# Patient Record
Sex: Male | Born: 1960 | Race: White | Hispanic: No | Marital: Single | Smoking: Never smoker
Health system: Southern US, Community
[De-identification: ages and names within clinical notes are randomized; demographics above are authoritative.]

## PROBLEM LIST (undated history)

## (undated) DIAGNOSIS — E785 Hyperlipidemia, unspecified: Secondary | ICD-10-CM

## (undated) DIAGNOSIS — F79 Unspecified intellectual disabilities: Secondary | ICD-10-CM

## (undated) HISTORY — DX: Hyperlipidemia, unspecified: E78.5

## (undated) HISTORY — DX: Unspecified intellectual disabilities: F79

## (undated) HISTORY — PX: OTHER SURGICAL HISTORY: SHX169

---

## 1997-10-31 ENCOUNTER — Other Ambulatory Visit: Admission: RE | Admit: 1997-10-31 | Discharge: 1997-10-31 | Payer: Self-pay | Admitting: Family Medicine

## 2001-05-16 ENCOUNTER — Emergency Department (HOSPITAL_COMMUNITY): Admission: EM | Admit: 2001-05-16 | Discharge: 2001-05-16 | Payer: Self-pay | Admitting: Emergency Medicine

## 2001-05-22 ENCOUNTER — Emergency Department (HOSPITAL_COMMUNITY): Admission: EM | Admit: 2001-05-22 | Discharge: 2001-05-22 | Payer: Self-pay

## 2011-12-07 ENCOUNTER — Encounter: Payer: Self-pay | Admitting: *Deleted

## 2021-12-10 ENCOUNTER — Telehealth: Payer: Self-pay | Admitting: Gastroenterology

## 2021-12-10 NOTE — Telephone Encounter (Signed)
Received referral to schedule colonoscopy. Patients insurance is not in network with [facility].  Dr. Silverio Decamp is Doc of Day for 12/10/21 AM. Records placed on desk for review.

## 2021-12-14 NOTE — Telephone Encounter (Addendum)
Dr. Lavon Paganini did not approve this request. Transfer request sent to be scanned.

## 2021-12-30 ENCOUNTER — Ambulatory Visit (INDEPENDENT_AMBULATORY_CARE_PROVIDER_SITE_OTHER): Payer: Medicare (Managed Care)

## 2021-12-30 ENCOUNTER — Ambulatory Visit (HOSPITAL_COMMUNITY)
Admission: EM | Admit: 2021-12-30 | Discharge: 2021-12-30 | Disposition: A | Discharge status: Home / Self Care | Payer: Medicare (Managed Care) | Attending: Physician Assistant | Admitting: Physician Assistant

## 2021-12-30 ENCOUNTER — Encounter (HOSPITAL_COMMUNITY): Payer: Self-pay | Admitting: Emergency Medicine

## 2021-12-30 DIAGNOSIS — R109 Unspecified abdominal pain: Secondary | ICD-10-CM | POA: Diagnosis not present

## 2021-12-30 DIAGNOSIS — R4689 Other symptoms and signs involving appearance and behavior: Secondary | ICD-10-CM | POA: Insufficient documentation

## 2021-12-30 DIAGNOSIS — R1030 Lower abdominal pain, unspecified: Secondary | ICD-10-CM | POA: Diagnosis present

## 2021-12-30 DIAGNOSIS — N5089 Other specified disorders of the male genital organs: Secondary | ICD-10-CM | POA: Insufficient documentation

## 2021-12-30 DIAGNOSIS — K429 Umbilical hernia without obstruction or gangrene: Secondary | ICD-10-CM | POA: Insufficient documentation

## 2021-12-30 LAB — POCT URINALYSIS DIPSTICK, ED / UC
Bilirubin Urine: NEGATIVE
Glucose, UA: NEGATIVE mg/dL
Ketones, ur: 15 mg/dL — AB
Leukocytes,Ua: NEGATIVE
Nitrite: NEGATIVE
Protein, ur: NEGATIVE mg/dL
Specific Gravity, Urine: 1.025 (ref 1.005–1.030)
Urobilinogen, UA: 2 mg/dL — ABNORMAL HIGH (ref 0.0–1.0)
pH: 5.5 (ref 5.0–8.0)

## 2021-12-30 MED ORDER — CICLOPIROX OLAMINE 0.77 % EX CREA: TOPICAL_CREAM | Freq: Two times a day (BID) | CUTANEOUS | 0 refills | Status: AC

## 2021-12-30 NOTE — Discharge Instructions (Signed)
His abdominal x-ray did show that he has some stool but no evidence of an obstruction.  I think he might have a combination of symptoms.  We did note the umbilical hernia on exam.  Please schedule an appointment with [facility] for evaluation.  The skin of the scrotum is very irritated.  Please apply Loprox twice daily for 14 days.  If his symptoms or not improving I do recommend following up with a urologist.  Please call to schedule an appointment.  If anything worsens he needs to be seen immediately.  Follow-up with PCP first thing next week.

## 2021-12-30 NOTE — ED Triage Notes (Signed)
Pt presents with caregiver.  Caregiver reports pt has been c/o lower abdominal pain and also c/o genital pain. States he has also been "playing" with his genitals

## 2021-12-30 NOTE — ED Provider Notes (Signed)
[facility]    CSN: [id number] Arrival date & time: 12/30/21  1046      History   Chief Complaint Chief Complaint  Patient presents with   Abdominal Pain    HPI Tim Wells is a 61 y.o. male.   Patient presents today accompanied by his caregiver who provides majority of history.  Patient has a history of intellectual disability and has difficulty communicating verbally.  Reports that recently has become more irritable and is complaining that his belly and penis are "down low".  Reports he is having normal bowel movements.  He is eating normally.  He was seen by his PCP for the symptoms at which point they attributed them to worsening mental function/anxiety.  He was started on something to help with his mental function and anxiety but these have not provided any significant relief of symptoms.  He completed 1 month of treatment but caregiver reports no change in symptoms.  Reports that he has been walking around with one-handed his pants and pulling on his penis which has become disturbing.  The symptoms have made it more difficult to care for him prompting evaluation.   Past Medical History:  Diagnosis Date   Dyslipidemia    Mental retardation     There are no problems to display for this patient.   Past Surgical History:  Procedure Laterality Date   none         Home Medications    Prior to Admission medications   Medication Sig Start Date End Date Taking? Authorizing Provider  ciclopirox (LOPROX) 0.77 % cream Apply topically 2 (two) times daily for 14 days. Please use twice daily for 2 weeks.  Discontinue 01/13/2022. 12/30/21 01/13/22 Yes Karole Oo, Noberto RetortErin K, PA-C    Family History History reviewed. No pertinent family history.  Social History Social History   Tobacco Use   Smoking status: Never  Substance Use Topics   Alcohol use: No     Allergies   Patient has no known allergies.   Review of Systems Review of Systems  Unable to perform ROS:  Patient nonverbal    Physical Exam Triage Vital Signs ED Triage Vitals  Enc Vitals Group     BP 12/30/21 1210 (!) 148/98     Pulse Rate 12/30/21 1210 91     Resp 12/30/21 1210 18     Temp 12/30/21 1210 98.4 F (36.9 C)     Temp Source 12/30/21 1210 Oral     SpO2 12/30/21 1210 100 %     Weight --      Height --      Head Circumference --      Peak Flow --      Pain Score 12/30/21 1207 0     Pain Loc --      Pain Edu? --      Excl. in GC? --    No data found.  Updated Vital Signs BP (!) 148/98 (BP Location: Left Arm)   Pulse 91   Temp 98.4 F (36.9 C) (Oral)   Resp 18   SpO2 100%   Visual Acuity Right Eye Distance:   Left Eye Distance:   Bilateral Distance:    Right Eye Near:   Left Eye Near:    Bilateral Near:     Physical Exam Vitals reviewed. Exam conducted with a chaperone present.  Constitutional:      General: He is awake.     Appearance: Normal appearance. He is well-developed. He  is not ill-appearing.     Comments: Very pleasant male appears stated age sitting on exam table in no acute distress  HENT:     Head: Normocephalic and atraumatic.     Mouth/Throat:     Pharynx: Uvula midline. No oropharyngeal exudate or posterior oropharyngeal erythema.  Cardiovascular:     Rate and Rhythm: Normal rate and regular rhythm.     Heart sounds: Normal heart sounds, S1 normal and S2 normal. No murmur heard. Pulmonary:     Effort: Pulmonary effort is normal.     Breath sounds: Normal breath sounds. No stridor. No wheezing, rhonchi or rales.     Comments: Clear to auscultation bilaterally Abdominal:     General: Bowel sounds are normal.     Palpations: Abdomen is soft.     Tenderness: There is no abdominal tenderness. There is no right CVA tenderness, left CVA tenderness, guarding or rebound.     Hernia: A hernia is present. Hernia is present in the umbilical area. There is no hernia in the left inguinal area or right inguinal area.     Comments: Benign  abdominal exam  Genitourinary:    Penis: Normal and circumcised.      Testes:        Right: Mass or tenderness not present.        Left: Mass or tenderness not present.     Comments: Erythematous and scaling skin noted scrotum and shaft of penis.  Garald Balding, RN present as chaperone during exam.  Neurological:     Mental Status: He is alert.  Psychiatric:        Behavior: Behavior is cooperative.     UC Treatments / Results  Labs (all labs ordered are listed, but only abnormal results are displayed) Labs Reviewed  POCT URINALYSIS DIPSTICK, ED / UC - Abnormal; Notable for the following components:      Result Value   Ketones, ur 15 (*)    Hgb urine dipstick TRACE (*)    Urobilinogen, UA 2.0 (*)    All other components within normal limits  URINE CULTURE    EKG   Radiology DG Abdomen 1 View  Result Date: 12/30/2021 CLINICAL DATA:  Abdominal pain. EXAM: ABDOMEN - 1 VIEW COMPARISON:  None Available. FINDINGS: Moderate stool burden, namely within the ascending and rectosigmoid colon. No small bowel dilatation. No unexpected radiopaque calculi. IMPRESSION: Moderate stool burden. Electronically Signed   By: Leanna Battles M.D.   On: 12/30/2021 12:48    Procedures Procedures (including critical care time)  Medications Ordered in UC Medications - No data to display  Initial Impression / Assessment and Plan / UC Course  I have reviewed the triage vital signs and the nursing notes.  Pertinent labs & imaging results that were available during my care of the patient were reviewed by me and considered in my medical decision making (see chart for details).     Patient is well-appearing, afebrile, nontoxic, nontachycardic.  No indication for emergent evaluation or imaging based on exam today.  Suspect symptoms are related to tinea curis and will start Loprox twice daily for 14 days.  UA showed no evidence of infection but given patient is nonverbal and concern for abdominal etiology  will obtain urine culture but defer antibiotics.  KUB was obtained that showed moderate stool burden otherwise was normal.  Recommended that he push fluids.  Discussed that if his symptoms or not improving with Loprox should follow-up with urology was given contact information for  local provider with instruction to call to schedule an appointment.  Discussed that umbilical hernias likely unrelated to other symptoms but if this continues to be bothersome can consider referral to surgeon for repair.  Discussed signs/symptoms of hernia incarceration/strangulation that would warrant emergent evaluation.  Recommended follow-up with PCP first thing next week.  Discussed alarm symptoms that warrant emergent evaluation.  Strict return precautions given.  Final Clinical Impressions(s) / UC Diagnoses   Final diagnoses:  Lower abdominal pain  Scrotal irritation  Behavioral change  Umbilical hernia without obstruction and without gangrene     Discharge Instructions      His abdominal x-ray did show that he has some stool but no evidence of an obstruction.  I think he might have a combination of symptoms.  We did note the umbilical hernia on exam.  Please schedule an appointment with [facility] for evaluation.  The skin of the scrotum is very irritated.  Please apply Loprox twice daily for 14 days.  If his symptoms or not improving I do recommend following up with a urologist.  Please call to schedule an appointment.  If anything worsens he needs to be seen immediately.  Follow-up with PCP first thing next week.     ED Prescriptions     Medication Sig Dispense Auth. Provider   ciclopirox (LOPROX) 0.77 % cream Apply topically 2 (two) times daily for 14 days. Please use twice daily for 2 weeks.  Discontinue 01/13/2022. 60 g Kimmerly Lora K, PA-C      PDMP not reviewed this encounter.   Jeani Hawking, PA-C 12/30/21 1305

## 2021-12-31 LAB — URINE CULTURE

## 2022-09-23 ENCOUNTER — Emergency Department (HOSPITAL_COMMUNITY): Payer: Medicare (Managed Care)

## 2022-09-23 ENCOUNTER — Encounter (HOSPITAL_COMMUNITY): Payer: Self-pay

## 2022-09-23 ENCOUNTER — Emergency Department (HOSPITAL_COMMUNITY)
Admission: EM | Admit: 2022-09-23 | Discharge: 2022-09-23 | Disposition: A | Discharge status: Home / Self Care | Payer: Medicare (Managed Care) | Attending: Emergency Medicine | Admitting: Emergency Medicine

## 2022-09-23 ENCOUNTER — Other Ambulatory Visit: Payer: Self-pay

## 2022-09-23 DIAGNOSIS — D72829 Elevated white blood cell count, unspecified: Secondary | ICD-10-CM | POA: Insufficient documentation

## 2022-09-23 DIAGNOSIS — S43015A Anterior dislocation of left humerus, initial encounter: Secondary | ICD-10-CM | POA: Insufficient documentation

## 2022-09-23 DIAGNOSIS — M25512 Pain in left shoulder: Secondary | ICD-10-CM | POA: Diagnosis present

## 2022-09-23 DIAGNOSIS — X58XXXA Exposure to other specified factors, initial encounter: Secondary | ICD-10-CM | POA: Diagnosis not present

## 2022-09-23 DIAGNOSIS — R1084 Generalized abdominal pain: Secondary | ICD-10-CM

## 2022-09-23 DIAGNOSIS — N2889 Other specified disorders of kidney and ureter: Secondary | ICD-10-CM | POA: Insufficient documentation

## 2022-09-23 DIAGNOSIS — K8020 Calculus of gallbladder without cholecystitis without obstruction: Secondary | ICD-10-CM | POA: Insufficient documentation

## 2022-09-23 DIAGNOSIS — N3090 Cystitis, unspecified without hematuria: Secondary | ICD-10-CM | POA: Diagnosis not present

## 2022-09-23 LAB — URINALYSIS, ROUTINE W REFLEX MICROSCOPIC
Bilirubin Urine: NEGATIVE
Glucose, UA: NEGATIVE mg/dL
Hgb urine dipstick: NEGATIVE
Ketones, ur: 20 mg/dL — AB
Leukocytes,Ua: NEGATIVE
Nitrite: NEGATIVE
Protein, ur: 30 mg/dL — AB
Specific Gravity, Urine: 1.031 — ABNORMAL HIGH (ref 1.005–1.030)
pH: 5 (ref 5.0–8.0)

## 2022-09-23 LAB — COMPREHENSIVE METABOLIC PANEL
ALT: 17 U/L (ref 0–44)
AST: 25 U/L (ref 15–41)
Albumin: 4 g/dL (ref 3.5–5.0)
Alkaline Phosphatase: 79 U/L (ref 38–126)
Anion gap: 8 (ref 5–15)
BUN: 12 mg/dL (ref 8–23)
CO2: 25 mmol/L (ref 22–32)
Calcium: 8.5 mg/dL — ABNORMAL LOW (ref 8.9–10.3)
Chloride: 108 mmol/L (ref 98–111)
Creatinine, Ser: 0.51 mg/dL — ABNORMAL LOW (ref 0.61–1.24)
GFR, Estimated: >60 mL/min (ref >60)
Glucose, Bld: 125 mg/dL — ABNORMAL HIGH (ref 70–99)
Potassium: 3.7 mmol/L (ref 3.5–5.1)
Sodium: 141 mmol/L (ref 135–145)
Total Bilirubin: 0.6 mg/dL (ref 0.3–1.2)
Total Protein: 6.8 g/dL (ref 6.5–8.1)

## 2022-09-23 LAB — CBC
HCT: 40.2 % (ref 39.0–52.0)
Hemoglobin: 13.3 g/dL (ref 13.0–17.0)
MCH: 33.3 pg (ref 26.0–34.0)
MCHC: 33.1 g/dL (ref 30.0–36.0)
MCV: 100.8 fL — ABNORMAL HIGH (ref 80.0–100.0)
Platelets: 256 10*3/uL (ref 150–400)
RBC: 3.99 MIL/uL — ABNORMAL LOW (ref 4.22–5.81)
RDW: 12.6 % (ref 11.5–15.5)
WBC: 16.8 10*3/uL — ABNORMAL HIGH (ref 4.0–10.5)
nRBC: 0 % (ref 0.0–0.2)

## 2022-09-23 LAB — LIPASE, BLOOD: Lipase: 41 U/L (ref 11–51)

## 2022-09-23 MED ORDER — IBUPROFEN 600 MG PO TABS: 600.0000 mg | ORAL_TABLET | Freq: Four times a day (QID) | ORAL | 0 refills | Status: DC | PRN

## 2022-09-23 MED ORDER — CEPHALEXIN 500 MG PO CAPS: 500.0000 mg | ORAL_CAPSULE | Freq: Two times a day (BID) | ORAL | 0 refills | Status: AC

## 2022-09-23 MED ORDER — IOHEXOL 300 MG/ML  SOLN
80.0000 mL | Freq: Once | INTRAMUSCULAR | Status: AC | PRN
  Administered 2022-09-23: 80 mL via INTRAVENOUS

## 2022-09-23 MED ORDER — HYDROMORPHONE HCL 1 MG/ML IJ SOLN
1.0000 mg | Freq: Once | INTRAMUSCULAR | Status: AC
  Administered 2022-09-23: 1 mg via INTRAVENOUS
  Filled 2022-09-23: qty 1

## 2022-09-23 NOTE — ED Provider Notes (Signed)
Emergency Department Provider Note   I have reviewed the triage vital signs and the nursing notes.   HISTORY  Chief Complaint Abdominal Pain and Shoulder Pain   HPI Tim Wells is a 62 y.o. male with PMH of cognitive delay and HLD presents to the ED with left shoulder pain and abdominal pain.  Patient is accompanied by caregiver.  He stays at a care home and has been there for 10 years.  He frequently hits himself in the shoulder area by swinging his arm around the front of his chest and hitting the contralateral shoulder.  Today, after doing this, he was complaining of pain in the left shoulder and not letting anyone move it.  No witnessed fall.  No symptoms yesterday.   The patient's caregiver states that the other reason he is here is that he is been complaining of abdominal pain for many months.  He has seen a primary care physician who believes this complaint is likely related to his underlying mental disorders (per caregiver) but she would like further evaluation. No fever. No vomiting or diarrhea.   Past Medical History:  Diagnosis Date   Dyslipidemia    Mental retardation     Review of Systems  Constitutional: No fever/chills Eyes: No visual changes. ENT: No sore throat. Cardiovascular: Denies chest pain. Respiratory: Denies shortness of breath. Gastrointestinal: Positive abdominal pain.  No nausea, no vomiting.  No diarrhea.  No constipation. Genitourinary: Negative for dysuria. Musculoskeletal: Negative for back pain. Positive shoulder pain.  Skin: Negative for rash. Neurological: Negative for headaches, focal weakness or numbness.   ____________________________________________   PHYSICAL EXAM:  VITAL SIGNS: ED Triage Vitals  Enc Vitals Group     BP 09/23/22 1816 (!) 159/98     Pulse Rate 09/23/22 1816 89     Resp 09/23/22 1816 18     Temp 09/23/22 1816 (!) 97.3 F (36.3 C)     Temp Source 09/23/22 1816 Oral     SpO2 09/23/22 1816 99 %     Weight  09/23/22 1854 110 lb (49.9 kg)     Height 09/23/22 1854 '5\' 9"'$  (1.753 m)    Constitutional: Alert and cooperative. Well appearing and in no acute distress. Eyes: Conjunctivae are normal.  Head: Atraumatic. Nose: No congestion/rhinnorhea. Mouth/Throat: Mucous membranes are moist.  Neck: No stridor.  Cardiovascular: Normal rate, regular rhythm. Good peripheral circulation. Grossly normal heart sounds.   Respiratory: Normal respiratory effort.  No retractions. Lungs CTAB. Gastrointestinal: Soft and nontender. No distention.  Musculoskeletal: Left arm held in flexion across the body with shoulder deformity. No tenderness at the elbow or wrist.  Neurologic:  Normal speech and language. No gross focal neurologic deficits are appreciated.  Skin:  Skin is warm, dry and intact. No rash noted.  ____________________________________________   LABS (all labs ordered are listed, but only abnormal results are displayed)  Labs Reviewed  COMPREHENSIVE METABOLIC PANEL - Abnormal; Notable for the following components:      Result Value   Glucose, Bld 125 (*)    Creatinine, Ser 0.51 (*)    Calcium 8.5 (*)    All other components within normal limits  CBC - Abnormal; Notable for the following components:   WBC 16.8 (*)    RBC 3.99 (*)    MCV 100.8 (*)    All other components within normal limits  URINALYSIS, ROUTINE W REFLEX MICROSCOPIC - Abnormal; Notable for the following components:   APPearance HAZY (*)    Specific  Gravity, Urine 1.031 (*)    Ketones, ur 20 (*)    Protein, ur 30 (*)    Bacteria, UA RARE (*)    All other components within normal limits  LIPASE, BLOOD   ____________________________________________  RADIOLOGY  CT ABDOMEN PELVIS W CONTRAST  Result Date: 09/23/2022 CLINICAL DATA:  Acute abdominal pain. EXAM: CT ABDOMEN AND PELVIS WITH CONTRAST TECHNIQUE: Multidetector CT imaging of the abdomen and pelvis was performed using the standard protocol following bolus  administration of intravenous contrast. RADIATION DOSE REDUCTION: This exam was performed according to the departmental dose-optimization program which includes automated exposure control, adjustment of the mA and/or kV according to patient size and/or use of iterative reconstruction technique. CONTRAST:  55m OMNIPAQUE IOHEXOL 300 MG/ML  SOLN COMPARISON:  Radiograph 12/30/2021 FINDINGS: Lower chest: No basilar airspace disease or pleural effusion. There are coronary artery calcifications. Hepatobiliary: Focal fatty infiltration adjacent to the falciform ligament. No suspicious liver lesion. Multiple calcified gallstones in the gallbladder. There is no pericholecystic inflammation. No biliary dilatation. Pancreas: No ductal dilatation or inflammation. Spleen: Normal in size without focal abnormality. Adrenals/Urinary Tract: Normal adrenal glands. Early excretion of IV contrast in both renal collecting systems which partially obscures assessment for renal calculi. No hydronephrosis. Small heterogeneous lesion in the posteroinferior left kidney measures 16 mm, series 3, image 30. There is suggestion of intralesional macroscopic fat. The urinary bladder is only minimally distended but thick walled. [place] in the bladder is likely excreted contrast. Stomach/Bowel: No bowel obstruction or inflammation. Moderate volume of stool in the colon. Normal appendix identified with moderate certainty. Vascular/Lymphatic: Mild aorto bi-iliac atherosclerosis. No aortic aneurysm. Patent portal, splenic and mesenteric veins. No adenopathy. Reproductive: Prostate is unremarkable. Other: No free air, free fluid, or intra-abdominal fluid collection. Tiny fat containing umbilical hernia. Musculoskeletal: Bilateral L5 pars interarticularis defects. No listhesis. No acute findings. IMPRESSION: 1. Bladder wall thickening, can be seen with cystitis. 2. Cholelithiasis without CT findings of acute cholecystitis. 3. Heterogeneous 16 mm lesion in  the posteroinferior left kidney with suggestion of intralesional macroscopic fat, suspicious for angiomyolipoma. Recommend nonemergent renal protocol MRI for definitive characterization. 4. Bilateral L5 pars interarticularis defects without listhesis. Aortic Atherosclerosis (ICD10-I70.0). Electronically Signed   By: MKeith RakeM.D.   On: 09/23/2022 21:42   DG Shoulder Left  Result Date: 09/23/2022 CLINICAL DATA:  Shoulder dislocation, postreduction. EXAM: LEFT SHOULDER - 2+ VIEW COMPARISON:  Pre reduction radiograph earlier today. FINDINGS: Interval reduction of previous anterior shoulder dislocation. The suspected Hill-Sachs impaction injury of the humeral head is not well-defined on the current exam. The acromioclavicular joint remains congruent. IMPRESSION: Interval reduction of previous anterior shoulder dislocation. The suspected Hill-Sachs impaction injury is not well-defined on the current exam. Electronically Signed   By: MKeith RakeM.D.   On: 09/23/2022 20:25   DG Shoulder Left  Result Date: 09/23/2022 CLINICAL DATA:  Pain after injury EXAM: LEFT SHOULDER - 3 VIEW COMPARISON:  None Available. FINDINGS: Shoulder dislocation with humeral head seen anterior inferior to the glenoid. There is some fragmentation noted along the upper outer aspect of the humeral head. Possible Hill-Sachs injury. Preserved AC joint and bone mineralization. IMPRESSION: Anterior inferior shoulder dislocation with possible Hill-Sachs bony injury of the humeral head. Electronically Signed   By: AJill SideM.D.   On: 09/23/2022 18:36    ____________________________________________   PROCEDURES  Procedure(s) performed:   Reduction of dislocation  Date/Time: 09/23/2022 8:53 PM  Performed by: LMargette Fast MD Authorized by: LMargette Fast  MD  Consent: Verbal consent obtained. Risks and benefits: risks, benefits and alternatives were discussed Consent given by: guardian Required items: required blood  products, implants, devices, and special equipment available Patient identity confirmed: verbally with patient Local anesthesia used: no  Anesthesia: Local anesthesia used: no  Sedation: Patient sedated: no  Patient tolerance: patient tolerated the procedure well with no immediate complications Comments: After adequate analgesia I was able to reduce the left shoulder dislocation with steady downward traction and left scapular manipulation.  Patient had improved pain and mobility.  X-ray confirms relocation of the joint.  Hill-Sachs deformity noted on initial x-ray.       ____________________________________________   INITIAL IMPRESSION / ASSESSMENT AND PLAN / ED COURSE  Pertinent labs & imaging results that were available during my care of the patient were reviewed by me and considered in my medical decision making (see chart for details).   This patient is Presenting for Evaluation of abdominal pain, which does require a range of treatment options, and is a complaint that involves a high risk of morbidity and mortality.  The Differential Diagnoses includes but is not exclusive to acute cholecystitis, intrathoracic causes for epigastric abdominal pain, gastritis, duodenitis, pancreatitis, small bowel or large bowel obstruction, abdominal aortic aneurysm, hernia, gastritis, etc.   Critical Interventions-    Medications  HYDROmorphone (DILAUDID) injection 1 mg (1 mg Intravenous Given 09/23/22 1946)  iohexol (OMNIPAQUE) 300 MG/ML solution 80 mL (80 mLs Intravenous Contrast Given 09/23/22 2114)    Reassessment after intervention:     I did obtain Additional Historical Information from caregiver at bedside.    Clinical Laboratory Tests Ordered, included with CBC with leukocytosis.  No acute kidney injury.  LFTs and bilirubin normal.  Lipase normal.   Radiologic Tests Ordered, included shoulder x-ray pre and post reduction along with CT scan. I independently interpreted the images  and agree with radiology interpretation.   Cardiac Monitor Tracing which shows NSR.    Medical Decision Making: Summary:  Patient presents to the emergency department for evaluation of left shoulder pain starting today.  Sounds like he has repetitive type behaviors of swinging his arms around in front of him and striking himself.  He does have a shoulder dislocation on the left with likely Hill-Sachs deformity.  The extremity is neurovascularly intact.   Nominal pain is more chronic in nature.  Abdomen soft and nontender.  Labs reassuring.   Reevaluation with update and discussion with patient and caregiver. Patient is feeling much better. Plan for abx covering for possible cystitis. Discussed kidney lesion and need to follow up with PCP for MRI. They will call tomorrow. Sling provided and ortho contact information provided.   Patient's presentation is most consistent with acute presentation with potential threat to life or bodily function.   Disposition: discharge  ____________________________________________  FINAL CLINICAL IMPRESSION(S) / ED DIAGNOSES  Final diagnoses:  Anterior dislocation of left shoulder, initial encounter  Cystitis  Generalized abdominal pain  Left kidney mass  Calculus of gallbladder without cholecystitis without obstruction     NEW OUTPATIENT MEDICATIONS STARTED DURING THIS VISIT:  New Prescriptions   CEPHALEXIN (KEFLEX) 500 MG CAPSULE    Take 1 capsule (500 mg total) by mouth 2 (two) times daily for 7 days.   IBUPROFEN (ADVIL) 600 MG TABLET    Take 1 tablet (600 mg total) by mouth every 6 (six) hours as needed for moderate pain.    Note:  This document was prepared using Systems analyst and  may include unintentional dictation errors.  Nanda Quinton, MD, [facility]    Kennadi Albany, [facility], MD 09/23/22 2204

## 2022-09-23 NOTE — Discharge Instructions (Signed)
You were seen in the emergency room today with abdominal pain as well as a left shoulder dislocation.  Were able to get the shoulder back and joint.  Please use the sling provided and take Tylenol and or ibuprofen as needed for pain.  Please call the orthopedist listed tomorrow morning to schedule follow-up in the coming week.   Your abdominal pain may be due to some inflammation of the bladder.  I am treating you with antibiotics for the next week.  Please follow closely with your primary care physician.  The CT scan also found some gallstones as well as a mass in the left kidney.  They are recommending follow-up MRI.  Your primary care doctor can help to coordinate this.

## 2022-09-23 NOTE — ED Triage Notes (Signed)
Pt arrives with caregiver with complaint of ongoing abdominal pain for an extended period of time. No n/v/d.  Caregiver states he has been evaluted for this but has continued to complain of pain.  Also reports left shoulder injury, possibly d/t fall, and possibly d/t pt "hitting himself' per caregiver.  Pt winces when taking off jacket but can move left arm.  Pulses intact.

## 2023-06-16 ENCOUNTER — Ambulatory Visit (HOSPITAL_COMMUNITY)
Admission: EM | Admit: 2023-06-16 | Discharge: 2023-06-16 | Disposition: A | Discharge status: Home / Self Care | Payer: Medicare (Managed Care) | Attending: Family Medicine | Admitting: Family Medicine

## 2023-06-16 ENCOUNTER — Ambulatory Visit (HOSPITAL_COMMUNITY): Payer: Medicare (Managed Care)

## 2023-06-16 ENCOUNTER — Encounter (HOSPITAL_COMMUNITY): Payer: Self-pay

## 2023-06-16 DIAGNOSIS — S62654A Nondisplaced fracture of medial phalanx of right ring finger, initial encounter for closed fracture: Secondary | ICD-10-CM

## 2023-06-16 DIAGNOSIS — S62652A Nondisplaced fracture of medial phalanx of right middle finger, initial encounter for closed fracture: Secondary | ICD-10-CM

## 2023-06-16 MED ORDER — IBUPROFEN 800 MG PO TABS: 800.0000 mg | ORAL_TABLET | Freq: Three times a day (TID) | ORAL | 0 refills | Status: AC

## 2023-06-16 NOTE — ED Provider Notes (Signed)
  [facility]   [id number] 06/16/23 Arrival Time: 1653  ASSESSMENT & PLAN:  1. Closed nondisplaced fracture of middle phalanx of right middle finger, initial encounter   2. Closed nondisplaced fracture of middle phalanx of right ring finger, initial encounter    I have personally viewed and independently interpreted the imaging studies ordered this visit. R hand: fractures of mid R 3rd and 4th fingers.  Finger splints applied. As needed: New Prescriptions   IBUPROFEN (ADVIL) 800 MG TABLET    Take 1 tablet (800 mg total) by mouth 3 (three) times daily with meals.   Recommend:  Follow-up Information     Schedule an appointment as soon as possible for a visit  with Chiaramonti, Edsel Petrin, MD.   Specialties: Orthopedic Surgery, Hand Surgery Contact information: [address] [place] [address] [phone]                Reviewed expectations re: course of current medical issues. Questions answered. Outlined signs and symptoms indicating need for more acute intervention. Patient verbalized understanding. After Visit Summary given.  SUBJECTIVE: History from: caregiver. Tim Wells is a 62 y.o. male who reports noting pt's RIGHT hand was swollen this evening. Unsure of trauma. Some bruising. He reports "falling down maybe". Limited history available. Pt does not really answer questions. Caregiver reports no MS changes. Denies emesis.  Past Surgical History:  Procedure Laterality Date   none      OBJECTIVE:  Vitals:   06/16/23 1743  BP: (!) 150/97  Pulse: (!) 106  Resp: 16  Temp: 98.9 F (37.2 C)  TempSrc: Oral  SpO2: 94%    General appearance: alert; no distress HEENT: [place]; subtle bruise above R eye; PERRLA; EOMI Neck: supple with FROM Resp: unlabored respirations Extremities: R hand: warm with well perfused appearance; fairly well localized TTP and bruising over mid R 3rd and 4th fingers; with FROM CV: brisk extremity capillary refill of RUE;  2+ radial pulse of RUE. Skin: warm and dry; no visible rashes Neurologic: gait normal; normal sensation and strength of RUE Psychological: alert and cooperative; normal mood and affect  Imaging: No results found.    No Known Allergies  Past Medical History:  Diagnosis Date   Dyslipidemia    Mental retardation    Social History   Socioeconomic History   Marital status: Single    Spouse name: Not on file   Number of children: 0   Years of education: Not on file   Highest education level: Not on file  Occupational History   Occupation: disabled  Tobacco Use   Smoking status: Never   Smokeless tobacco: Not on file  Substance and Sexual Activity   Alcohol use: No   Drug use: Not on file   Sexual activity: Not on file  Other Topics Concern   Not on file  Social History Narrative   Not on file   Social Determinants of Health   Financial Resource Strain: Not on file  Food Insecurity: Not on file  Transportation Needs: Not on file  Physical Activity: Not on file  Stress: Not on file  Social Connections: Unknown (09/29/2022)   Received from [facility]   Social Network    Social Network: Not on file   History reviewed. No pertinent family history. Past Surgical History:  Procedure Laterality Date   none         Mardella Layman, MD 06/16/23 [id number]

## 2023-06-16 NOTE — ED Triage Notes (Signed)
Pt presents with his care taker. States she noticed his right hand index finger turning purple. Pt has mental retardation and is not able to communicate what happened to his finger.

## 2024-02-18 IMAGING — DX DG ABDOMEN 1V
1 series · 1 of 1 positions shown · non-contrast
Comparison: None Available.

CLINICAL DATA: Abdominal pain.

EXAM:
ABDOMEN - 1 VIEW

[abdomen kub]
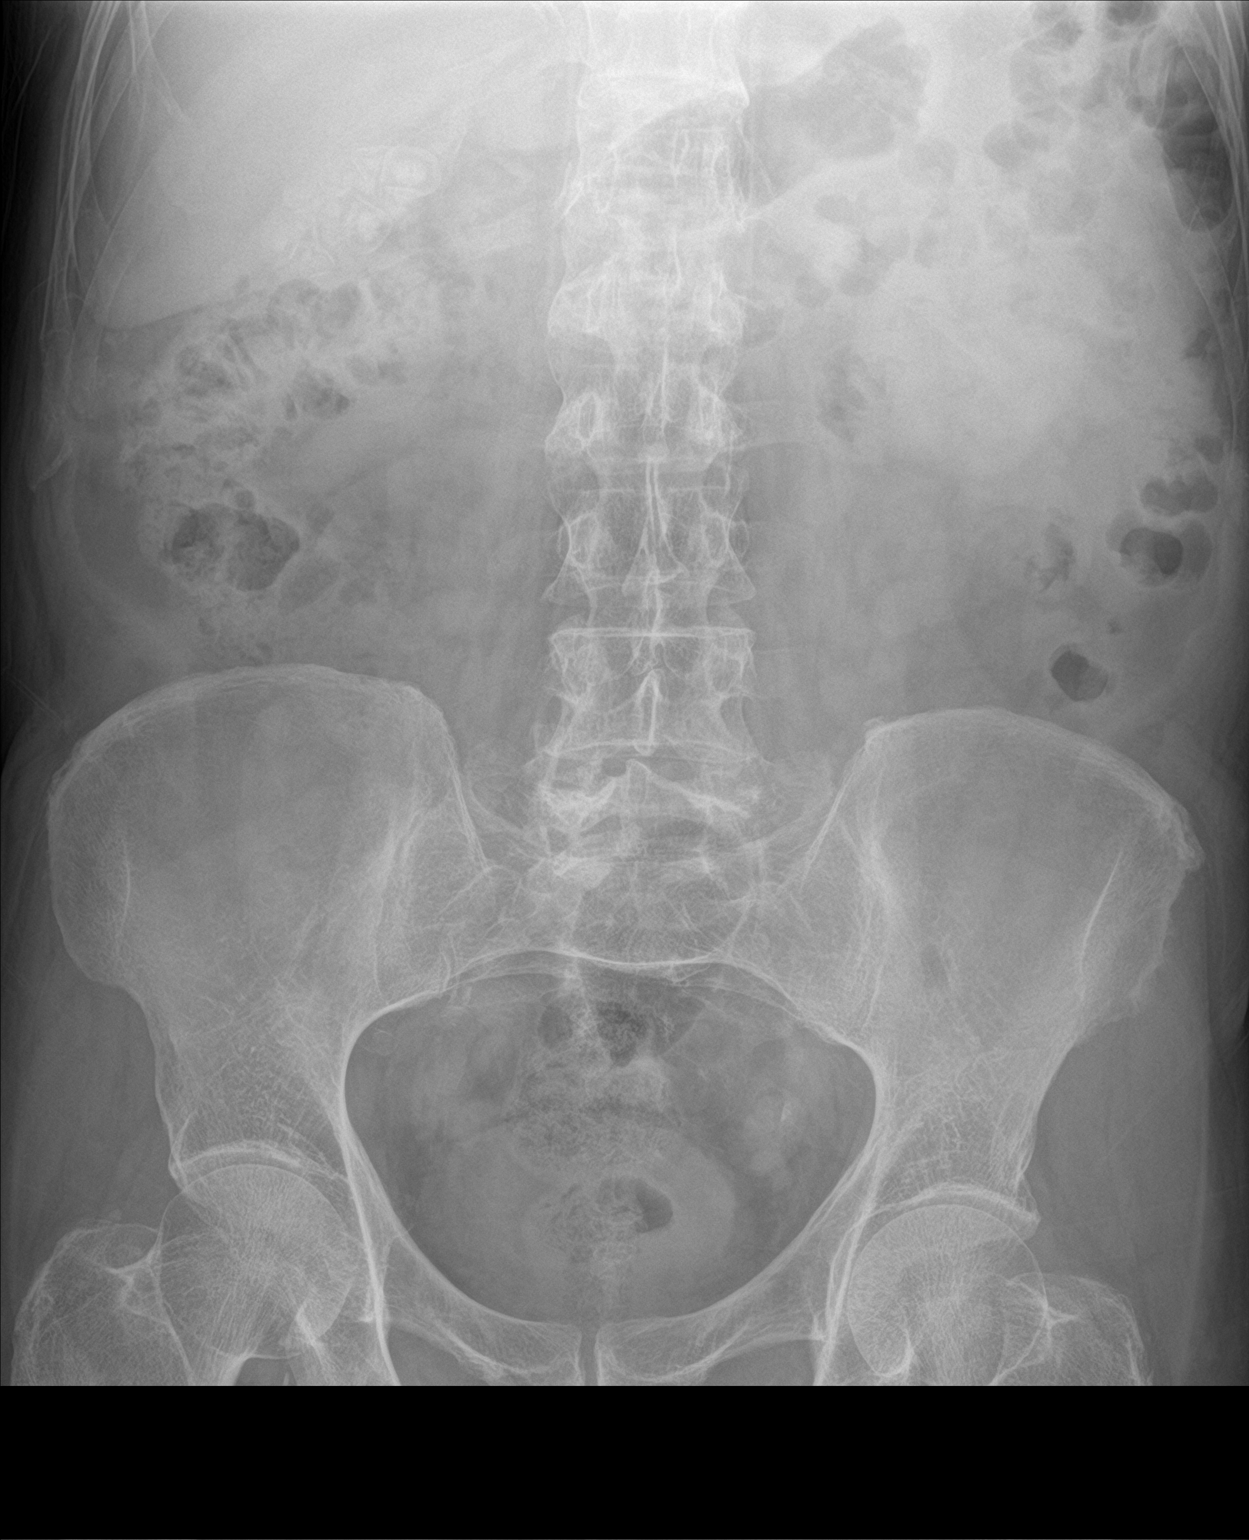

[1 of 1 positions shown; findings below may reference images not displayed]

FINDINGS: Moderate stool burden, namely within the ascending and rectosigmoid
colon. No small bowel dilatation. No unexpected radiopaque calculi.
IMPRESSION: Moderate stool burden.
